# Patient Record
Sex: Female | Born: 1965 | Hispanic: Yes | Smoking: Never smoker
Health system: Southern US, Community
[De-identification: ages and names within clinical notes are randomized; demographics above are authoritative.]

## PROBLEM LIST (undated history)

## (undated) DIAGNOSIS — Z940 Kidney transplant status: Secondary | ICD-10-CM

## (undated) DIAGNOSIS — I10 Essential (primary) hypertension: Secondary | ICD-10-CM

## (undated) DIAGNOSIS — E119 Type 2 diabetes mellitus without complications: Secondary | ICD-10-CM

## (undated) HISTORY — PX: NEPHRECTOMY TRANSPLANTED ORGAN: SUR880

## (undated) HISTORY — PX: ABDOMINAL HYSTERECTOMY: SHX81

---

## 2019-03-18 ENCOUNTER — Emergency Department (HOSPITAL_COMMUNITY)
Admission: EM | Admit: 2019-03-18 | Discharge: 2019-03-18 | Disposition: A | Discharge status: Home / Self Care | Payer: BLUE CROSS/BLUE SHIELD | Attending: Emergency Medicine | Admitting: Emergency Medicine

## 2019-03-18 ENCOUNTER — Other Ambulatory Visit: Payer: Self-pay

## 2019-03-18 ENCOUNTER — Emergency Department (HOSPITAL_COMMUNITY): Payer: BLUE CROSS/BLUE SHIELD

## 2019-03-18 ENCOUNTER — Encounter (HOSPITAL_COMMUNITY): Payer: Self-pay

## 2019-03-18 DIAGNOSIS — R197 Diarrhea, unspecified: Secondary | ICD-10-CM

## 2019-03-18 DIAGNOSIS — U071 COVID-19: Secondary | ICD-10-CM | POA: Insufficient documentation

## 2019-03-18 DIAGNOSIS — Z940 Kidney transplant status: Secondary | ICD-10-CM | POA: Insufficient documentation

## 2019-03-18 DIAGNOSIS — R05 Cough: Secondary | ICD-10-CM | POA: Diagnosis not present

## 2019-03-18 DIAGNOSIS — R110 Nausea: Secondary | ICD-10-CM

## 2019-03-18 DIAGNOSIS — E119 Type 2 diabetes mellitus without complications: Secondary | ICD-10-CM | POA: Diagnosis not present

## 2019-03-18 DIAGNOSIS — I10 Essential (primary) hypertension: Secondary | ICD-10-CM | POA: Diagnosis not present

## 2019-03-18 DIAGNOSIS — R509 Fever, unspecified: Secondary | ICD-10-CM | POA: Diagnosis present

## 2019-03-18 HISTORY — DX: Essential (primary) hypertension: I10

## 2019-03-18 HISTORY — DX: Kidney transplant status: Z94.0

## 2019-03-18 HISTORY — DX: Type 2 diabetes mellitus without complications: E11.9

## 2019-03-18 LAB — CBC
HCT: 37.4 % (ref 36.0–46.0)
Hemoglobin: 12.2 g/dL (ref 12.0–15.0)
MCH: 28.7 pg (ref 26.0–34.0)
MCHC: 32.6 g/dL (ref 30.0–36.0)
MCV: 88 fL (ref 80.0–100.0)
Platelets: 206 10*3/uL (ref 150–400)
RBC: 4.25 MIL/uL (ref 3.87–5.11)
RDW: 12.7 % (ref 11.5–15.5)
WBC: 3.2 10*3/uL — ABNORMAL LOW (ref 4.0–10.5)
nRBC: 0 % (ref 0.0–0.2)

## 2019-03-18 LAB — URINALYSIS, ROUTINE W REFLEX MICROSCOPIC
Bilirubin Urine: NEGATIVE
Glucose, UA: NEGATIVE mg/dL
Hgb urine dipstick: NEGATIVE
Ketones, ur: NEGATIVE mg/dL
Leukocytes,Ua: NEGATIVE
Nitrite: NEGATIVE
Protein, ur: NEGATIVE mg/dL
Specific Gravity, Urine: 1.008 (ref 1.005–1.030)
pH: 6 (ref 5.0–8.0)

## 2019-03-18 LAB — COMPREHENSIVE METABOLIC PANEL
ALT: 14 U/L (ref 0–44)
AST: 18 U/L (ref 15–41)
Albumin: 3.7 g/dL (ref 3.5–5.0)
Alkaline Phosphatase: 55 U/L (ref 38–126)
Anion gap: 8 (ref 5–15)
BUN: 7 mg/dL (ref 6–20)
CO2: 27 mmol/L (ref 22–32)
Calcium: 8.4 mg/dL — ABNORMAL LOW (ref 8.9–10.3)
Chloride: 107 mmol/L (ref 98–111)
Creatinine, Ser: 0.73 mg/dL (ref 0.44–1.00)
GFR calc Af Amer: >60 mL/min (ref >60)
GFR calc non Af Amer: >60 mL/min (ref >60)
Glucose, Bld: 94 mg/dL (ref 70–99)
Potassium: 3.1 mmol/L — ABNORMAL LOW (ref 3.5–5.1)
Sodium: 142 mmol/L (ref 135–145)
Total Bilirubin: 0.6 mg/dL (ref 0.3–1.2)
Total Protein: 6.7 g/dL (ref 6.5–8.1)

## 2019-03-18 LAB — LIPASE, BLOOD: Lipase: 25 U/L (ref 11–51)

## 2019-03-18 LAB — LACTIC ACID, PLASMA
Lactic Acid, Venous: 0.8 mmol/L (ref 0.5–1.9)
Lactic Acid, Venous: 0.6 mmol/L (ref 0.5–1.9)

## 2019-03-18 LAB — RESPIRATORY PANEL BY RT PCR (FLU A&B, COVID)
Influenza A by PCR: NEGATIVE
Influenza B by PCR: NEGATIVE
SARS Coronavirus 2 by RT PCR: POSITIVE — AB

## 2019-03-18 MED ORDER — ACETAMINOPHEN 325 MG PO TABS
650.0000 mg | ORAL_TABLET | Freq: Once | ORAL | Status: AC
  Administered 2019-03-18: 650 mg via ORAL
  Filled 2019-03-18: qty 2

## 2019-03-18 MED ORDER — SODIUM CHLORIDE 0.9 % IV BOLUS
500.0000 mL | Freq: Once | INTRAVENOUS | Status: AC
  Administered 2019-03-18: 500 mL via INTRAVENOUS

## 2019-03-18 MED ORDER — SODIUM CHLORIDE 0.9% FLUSH: 3.0000 mL | Freq: Once | INTRAVENOUS | Status: DC

## 2019-03-18 MED ORDER — ONDANSETRON 4 MG PO TBDP
4.0000 mg | ORAL_TABLET | Freq: Once | ORAL | Status: AC
  Administered 2019-03-18: 4 mg via ORAL
  Filled 2019-03-18: qty 1

## 2019-03-18 MED ORDER — LOPERAMIDE HCL 2 MG PO CAPS: 2.0000 mg | ORAL_CAPSULE | Freq: Four times a day (QID) | ORAL | 0 refills | Status: AC | PRN

## 2019-03-18 MED ORDER — ONDANSETRON 4 MG PO TBDP: 4.0000 mg | ORAL_TABLET | Freq: Three times a day (TID) | ORAL | 0 refills | Status: AC | PRN

## 2019-03-18 NOTE — Discharge Instructions (Signed)
Hoy te atendieron en el departamento de emergencias y te diagnosticaron COVID-19. Puede tomar Tylenol segn sea necesario para el dolor de cabeza junto con Zofran e Imodium segn sea necesario para las nuseas y la diarrea. Debe regresar al departamento de emergencias de inmediato si sus sntomas empeoran. Llame a su mdico de atencin primaria maana para alertarlo sobre su infeccin por COVID-19.

## 2019-03-18 NOTE — ED Notes (Signed)
Provided pt labeled urine specimen cup for collection per MD order. [employer]

## 2019-03-18 NOTE — ED Notes (Signed)
Date and time results received: 03/18/19 2228 (use smartphrase ".now" to insert current time)  Test: COVID Critical Value: Positive  Name of Provider Notified: Dr.Long  Orders Received? Or Actions Taken?:

## 2019-03-18 NOTE — ED Notes (Signed)
Labeled culture sent w/urine specimen to lab. [employer]

## 2019-03-18 NOTE — ED Provider Notes (Signed)
Emergency Department Provider Note   I have reviewed the triage vital signs and the nursing notes.   HISTORY  Chief Complaint No chief complaint on file.  Spanish interpreter (video) used for HPI, ROS, and Exam.  HPI Jane Hicks is a 54 y.o. female with past medical history of hypertension, diabetes, kidney transplant in 2013 in [place], [place] presents to the emergency department for evaluation of subjective fever, body aches, cough, diarrhea, and nausea.  Patient has been feeling poorly for the last 1 to 2 weeks with symptoms seeming to worsen.  Her last episode of diarrhea was yesterday.  She denies blood in the diarrhea.  No recent antibiotics.  She has had nausea but no vomiting.  She is not experiencing abdominal pain or chest pain.  She denies shortness of breath.  No known COVID-19 contact.  She remains compliant with her home medications including tacrolimus.  She does not follow with a primary care doctor locally.  She continues to make urine without difficulty. Tmax 101 F was recorded at home yesterday.    Past Medical History:  Diagnosis Date  . Diabetes mellitus without complication (HCC)   . Hypertension   . Kidney transplant recipient     There are no problems to display for this patient.   Past Surgical History:  Procedure Laterality Date  . ABDOMINAL HYSTERECTOMY    . NEPHRECTOMY TRANSPLANTED ORGAN      Allergies Patient has no known allergies.  Family History  Problem Relation Age of Onset  . Chronic Renal Failure Mother     Social History Social History   Tobacco Use  . Smoking status: Never Smoker  . Smokeless tobacco: Never Used  Substance Use Topics  . Alcohol use: Never  . Drug use: Never    Review of Systems  Constitutional: Positive fever and body aches.  Eyes: No visual changes. ENT: No sore throat. Cardiovascular: Denies chest pain. Respiratory: Denies shortness of breath. Positive cough.  Gastrointestinal: No abdominal pain.  Positive nausea, no vomiting. Positive diarrhea.  No constipation. Genitourinary: Negative for dysuria. Musculoskeletal: Negative for back pain. Skin: Negative for rash. Neurological: Negative for focal weakness or numbness. Positive HA.   10-point ROS otherwise negative.  ____________________________________________   PHYSICAL EXAM:  VITAL SIGNS: ED Triage Vitals  Enc Vitals Group     BP 03/18/19 1701 108/73     Pulse Rate 03/18/19 1701 78     Resp 03/18/19 1701 18     Temp 03/18/19 1701 100.1 F (37.8 C)     Temp Source 03/18/19 1701 Oral     SpO2 03/18/19 1701 98 %     Weight 03/18/19 1722 138 lb (62.6 kg)     Height 03/18/19 1722 5\' 6"  (1.676 m)   Constitutional: Alert and oriented. Well appearing and in no acute distress. Eyes: Conjunctivae are normal.  Head: Atraumatic. Nose: No congestion/rhinnorhea. Mouth/Throat: Mucous membranes are moist.  Neck: No stridor.  No meningeal signs.   Cardiovascular: Normal rate, regular rhythm. Good peripheral circulation. Grossly normal heart sounds.   Respiratory: Normal respiratory effort.  No retractions. Lungs CTAB. Gastrointestinal: Soft with focal RLQ tenderness. No rebound or guarding. No distention.  Musculoskeletal: No lower extremity tenderness nor edema. No gross deformities of extremities. Neurologic:  Normal speech and language. No gross focal neurologic deficits are appreciated.  Skin:  Skin is warm, dry and intact. No rash noted. ____________________________________________   LABS (all labs ordered are listed, but only abnormal results are displayed)  Labs  Reviewed  RESPIRATORY PANEL BY RT PCR (FLU A&B, COVID) - Abnormal; Notable for the following components:      Result Value   SARS Coronavirus 2 by RT PCR POSITIVE (*)    All other components within normal limits  COMPREHENSIVE METABOLIC PANEL - Abnormal; Notable for the following components:   Potassium 3.1 (*)    Calcium 8.4 (*)    All other components  within normal limits  CBC - Abnormal; Notable for the following components:   WBC 3.2 (*)    All other components within normal limits  CULTURE, BLOOD (ROUTINE X 2)  CULTURE, BLOOD (ROUTINE X 2)  LIPASE, BLOOD  URINALYSIS, ROUTINE W REFLEX MICROSCOPIC  LACTIC ACID, PLASMA  LACTIC ACID, PLASMA   ____________________________________________  RADIOLOGY  DG Chest Portable 1 View  Result Date: 03/18/2019 CLINICAL DATA:  Fever, cough EXAM: PORTABLE CHEST 1 VIEW COMPARISON:  None. FINDINGS: Heart is normal size. Bilateral lower lobe airspace opacities concerning for pneumonia. No effusions. No acute bony abnormality. IMPRESSION: Bilateral lower lobe airspace opacities concerning for pneumonia. Electronically Signed   By: Rolm Baptise M.D.   On: 03/18/2019 20:53    ____________________________________________   PROCEDURES  Procedure(s) performed:   Procedures  None ____________________________________________   INITIAL IMPRESSION / ASSESSMENT AND PLAN / ED COURSE  Pertinent labs & imaging results that were available during my care of the patient were reviewed by me and considered in my medical decision making (see chart for details).   Patient with history of kidney transplant presents with fever at home with body aches and other constitutional type symptoms.  Denies abdominal pain but does appear to have some more focal tenderness in the right lower quadrant.  COVID-19 is a consideration.  Lower suspicion clinically for acute appendicitis but given her focal tenderness I will follow on additional lab work including cultures and lactate and consider CT imaging as needed. IVF given in the ED for now.  Concern clinically for serious bacterial infection, bacteremia, meningitis is very low.  CXR with evidence of PNA dn COVID PCR test is positive. Labs unremarkable. No significant respiratory symptoms. Specifically no CP or SOB. No hypoxemia. Discussed COVID diagnosis with patient using  Spanish interpreter. Plan for supportive care at home and close PCP follow up by phone. Patient plans to call tomorrow. Discussed strict ED return precautions with worsening symptoms and discussed that she is at elevated risk for worsening given her immunocompromised status s/p kidney transplant but that she does not require hospitalization at this time.   Jane Hicks was evaluated in Emergency Department on 03/19/2019 for the symptoms described in the history of present illness. She was evaluated in the context of the global COVID-19 pandemic, which necessitated consideration that the patient might be at risk for infection with the SARS-CoV-2 virus that causes COVID-19. Institutional protocols and algorithms that pertain to the evaluation of patients at risk for COVID-19 are in a state of rapid change based on information released by regulatory bodies including the CDC and federal and state organizations. These policies and algorithms were followed during the patient's care in the ED.  ____________________________________________  FINAL CLINICAL IMPRESSION(S) / ED DIAGNOSES  Final diagnoses:  COVID-19  Diarrhea of presumed infectious origin  Nausea     MEDICATIONS GIVEN DURING THIS VISIT:  Medications  sodium chloride 0.9 % bolus 500 mL (0 mLs Intravenous Stopped 03/18/19 2102)  acetaminophen (TYLENOL) tablet 650 mg (650 mg Oral Given 03/18/19 2309)  ondansetron (ZOFRAN-ODT) disintegrating tablet 4 mg (  4 mg Oral Given 03/18/19 2309)     NEW OUTPATIENT MEDICATIONS STARTED DURING THIS VISIT:  Discharge Medication List as of 03/18/2019 10:54 PM    START taking these medications   Details  loperamide (IMODIUM) 2 MG capsule Take 1 capsule (2 mg total) by mouth 4 (four) times daily as needed for diarrhea or loose stools., Starting Mon 03/18/2019, Print    ondansetron (ZOFRAN ODT) 4 MG disintegrating tablet Take 1 tablet (4 mg total) by mouth every 8 (eight) hours as needed for nausea or vomiting.,  Starting Mon 03/18/2019, Print        Note:  This document was prepared using Dragon voice recognition software and may include unintentional dictation errors.  Alona Bene, MD, [facility]    Joe Tanney, Arlyss Repress, MD 03/19/19 1009

## 2019-03-18 NOTE — ED Triage Notes (Signed)
Patient c/o headache, nausea, back pain, and nausea x 2 weeks.  Patient has a history of kidney transplant.

## 2019-03-23 LAB — CULTURE, BLOOD (ROUTINE X 2)
Culture: NO GROWTH
Culture: NO GROWTH
Special Requests: ADEQUATE

## 2019-04-04 ENCOUNTER — Emergency Department (HOSPITAL_COMMUNITY)
Admission: EM | Admit: 2019-04-04 | Discharge: 2019-04-04 | Disposition: A | Discharge status: Home / Self Care | Payer: BLUE CROSS/BLUE SHIELD | Attending: Emergency Medicine | Admitting: Emergency Medicine

## 2019-04-04 ENCOUNTER — Emergency Department (HOSPITAL_COMMUNITY): Payer: BLUE CROSS/BLUE SHIELD

## 2019-04-04 ENCOUNTER — Other Ambulatory Visit: Payer: Self-pay

## 2019-04-04 ENCOUNTER — Ambulatory Visit (HOSPITAL_BASED_OUTPATIENT_CLINIC_OR_DEPARTMENT_OTHER): Payer: BLUE CROSS/BLUE SHIELD

## 2019-04-04 DIAGNOSIS — Z79899 Other long term (current) drug therapy: Secondary | ICD-10-CM | POA: Insufficient documentation

## 2019-04-04 DIAGNOSIS — E119 Type 2 diabetes mellitus without complications: Secondary | ICD-10-CM | POA: Diagnosis not present

## 2019-04-04 DIAGNOSIS — Z940 Kidney transplant status: Secondary | ICD-10-CM | POA: Diagnosis not present

## 2019-04-04 DIAGNOSIS — I10 Essential (primary) hypertension: Secondary | ICD-10-CM | POA: Insufficient documentation

## 2019-04-04 DIAGNOSIS — R52 Pain, unspecified: Secondary | ICD-10-CM | POA: Diagnosis not present

## 2019-04-04 DIAGNOSIS — R2243 Localized swelling, mass and lump, lower limb, bilateral: Secondary | ICD-10-CM | POA: Diagnosis not present

## 2019-04-04 DIAGNOSIS — R1030 Lower abdominal pain, unspecified: Secondary | ICD-10-CM | POA: Diagnosis present

## 2019-04-04 DIAGNOSIS — U071 COVID-19: Secondary | ICD-10-CM | POA: Insufficient documentation

## 2019-04-04 DIAGNOSIS — Z7984 Long term (current) use of oral hypoglycemic drugs: Secondary | ICD-10-CM | POA: Insufficient documentation

## 2019-04-04 LAB — CBC WITH DIFFERENTIAL/PLATELET
Abs Immature Granulocytes: 0.03 10*3/uL (ref 0.00–0.07)
Basophils Absolute: 0.1 10*3/uL (ref 0.0–0.1)
Basophils Relative: 2 %
Eosinophils Absolute: 0.2 10*3/uL (ref 0.0–0.5)
Eosinophils Relative: 4 %
HCT: 35.4 % — ABNORMAL LOW (ref 36.0–46.0)
Hemoglobin: 11.5 g/dL — ABNORMAL LOW (ref 12.0–15.0)
Immature Granulocytes: 1 %
Lymphocytes Relative: 27 %
Lymphs Abs: 1.4 10*3/uL (ref 0.7–4.0)
MCH: 29 pg (ref 26.0–34.0)
MCHC: 32.5 g/dL (ref 30.0–36.0)
MCV: 89.2 fL (ref 80.0–100.0)
Monocytes Absolute: 0.7 10*3/uL (ref 0.1–1.0)
Monocytes Relative: 12 %
Neutro Abs: 3 10*3/uL (ref 1.7–7.7)
Neutrophils Relative %: 54 %
Platelets: 358 10*3/uL (ref 150–400)
RBC: 3.97 MIL/uL (ref 3.87–5.11)
RDW: 13.4 % (ref 11.5–15.5)
WBC: 5.4 10*3/uL (ref 4.0–10.5)
nRBC: 0 % (ref 0.0–0.2)

## 2019-04-04 LAB — COMPREHENSIVE METABOLIC PANEL
ALT: 17 U/L (ref 0–44)
AST: 16 U/L (ref 15–41)
Albumin: 3.8 g/dL (ref 3.5–5.0)
Alkaline Phosphatase: 55 U/L (ref 38–126)
Anion gap: 9 (ref 5–15)
BUN: 14 mg/dL (ref 6–20)
CO2: 26 mmol/L (ref 22–32)
Calcium: 8.5 mg/dL — ABNORMAL LOW (ref 8.9–10.3)
Chloride: 109 mmol/L (ref 98–111)
Creatinine, Ser: 0.82 mg/dL (ref 0.44–1.00)
GFR calc Af Amer: >60 mL/min (ref >60)
GFR calc non Af Amer: >60 mL/min (ref >60)
Glucose, Bld: 98 mg/dL (ref 70–99)
Potassium: 3.5 mmol/L (ref 3.5–5.1)
Sodium: 144 mmol/L (ref 135–145)
Total Bilirubin: 0.3 mg/dL (ref 0.3–1.2)
Total Protein: 6.8 g/dL (ref 6.5–8.1)

## 2019-04-04 LAB — URINALYSIS, ROUTINE W REFLEX MICROSCOPIC
Bilirubin Urine: NEGATIVE
Glucose, UA: NEGATIVE mg/dL
Hgb urine dipstick: NEGATIVE
Ketones, ur: NEGATIVE mg/dL
Leukocytes,Ua: NEGATIVE
Nitrite: NEGATIVE
Protein, ur: NEGATIVE mg/dL
Specific Gravity, Urine: 1.012 (ref 1.005–1.030)
pH: 8 (ref 5.0–8.0)

## 2019-04-04 LAB — BRAIN NATRIURETIC PEPTIDE: B Natriuretic Peptide: 90.1 pg/mL (ref 0.0–100.0)

## 2019-04-04 NOTE — Discharge Instructions (Addendum)
Todos sus resultados fueron normales hoy.  Por favor haga cita con su doctor primario para seguir con sus chequeos.  Su prueba de Covid estara lista en 24-48 horas.

## 2019-04-04 NOTE — ED Triage Notes (Signed)
Per patient, she is a kidney transplant recipient. She has developed pain in back, abdomen and lower legs. Also states lower legs are swelling. Patient went to UC to get her "kidney function" checked and UC referred to ED.

## 2019-04-04 NOTE — Progress Notes (Signed)
VASCULAR LAB PRELIMINARY  PRELIMINARY  PRELIMINARY  PRELIMINARY  Bilateral lower extremity venous duplex completed.    Preliminary report:  See CV proc for preliminary results.  Gave Dr. Particia Nearing results.   Hadlie Gipson, RVT 04/04/2019, 3:41 PM

## 2019-04-04 NOTE — ED Provider Notes (Signed)
[facility]-EMERGENCY DEPT Provider Note   CSN: [id number] Arrival date & time: 04/04/19  1142     History Chief Complaint  Patient presents with  . Back Pain  . Foot Swelling    Jane Hicks is a 54 y.o. female.  54 y.o female with a PMH of DM, HTN, Kidney transplant on 01/01/2012 presents to the ED with a chief complaint of bilateral leg swelling along with abdominal pain x 4 days. Patient reports pain along the lower aspect of her abdomen this is somewhat cramping and feels similar to her prior episode of kidney stones. She also endorses leg swelling and itching, reports this is worse at night and alleviated with propping her legs up.  She was evaluated in urgent care this morning however sent to the ED for further care.  She does also endorse some dysuria, reports she is noted her urine to be of darker color.  Denies any prior history of diabetes, this is induced by the medication she has been taking for her kidney transplant.  Also endorses back pain, this seems to be more on the upper side worse with movement along with sitting.  Patient endorses chills.  No fever, nausea, vomiting,chest pain. Of note, patient did covid 19 infection on 03/18/2019 but reports her symptoms have now resolved and she is asymptomatic.   The history is provided by the patient and medical records.  Back Pain Associated symptoms: abdominal pain   Associated symptoms: no chest pain, no fever and no headaches        Past Medical History:  Diagnosis Date  . Diabetes mellitus without complication (HCC)   . Hypertension   . Kidney transplant recipient     There are no problems to display for this patient.   Past Surgical History:  Procedure Laterality Date  . ABDOMINAL HYSTERECTOMY    . NEPHRECTOMY TRANSPLANTED ORGAN       OB History   No obstetric history on file.     Family History  Problem Relation Age of Onset  . Chronic Renal Failure Mother     Social History    Tobacco Use  . Smoking status: Never Smoker  . Smokeless tobacco: Never Used  Substance Use Topics  . Alcohol use: Never  . Drug use: Never    Home Medications Prior to Admission medications   Medication Sig Start Date End Date Taking? Authorizing Provider  cloNIDine (CATAPRES) 0.1 MG tablet Take 0.1 mg by mouth daily as needed (anxiety).   Yes [provider]  glipiZIDE (GLUCOTROL) 5 MG tablet Take 5 mg by mouth daily before breakfast.   Yes [provider]  loperamide (IMODIUM) 2 MG capsule Take 1 capsule (2 mg total) by mouth 4 (four) times daily as needed for diarrhea or loose stools. 03/18/19  Yes Long, Arlyss Repress, MD  mycophenolate (MYFORTIC) 360 MG TBEC EC tablet Take 360 mg by mouth 3 (three) times daily.   Yes [provider]  NIFEdipine (PROCARDIA) 10 MG capsule Take 10 mg by mouth 3 (three) times daily.   Yes [provider]  ondansetron (ZOFRAN ODT) 4 MG disintegrating tablet Take 1 tablet (4 mg total) by mouth every 8 (eight) hours as needed for nausea or vomiting. 03/18/19  Yes Long, Arlyss Repress, MD  pantoprazole (PROTONIX) 40 MG tablet Take 40 mg by mouth daily.   Yes [provider]  potassium chloride (KLOR-CON) 20 MEQ packet Take 20 mEq by mouth daily.   Yes [provider]  saxagliptin HCl (ONGLYZA) 5 MG TABS tablet Take 5 mg by mouth daily.   Yes [provider]  simvastatin (ZOCOR) 10 MG tablet Take 10 mg by mouth daily.   Yes [provider]  spironolactone (ALDACTONE) 100 MG tablet Take 100 mg by mouth daily.   Yes [provider]  tacrolimus (PROGRAF) 1 MG capsule Take 3 mg by mouth 2 (two) times daily.   Yes [provider]    Allergies    Patient has no known allergies.  Review of Systems   Review of Systems  Constitutional: Positive for chills. Negative for fever.  HENT: Negative for sinus pressure and sore throat.   Respiratory: Negative for shortness of breath.    Cardiovascular: Negative for chest pain.  Gastrointestinal: Positive for abdominal pain. Negative for nausea and vomiting.  Genitourinary: Negative for flank pain.  Musculoskeletal: Positive for back pain.  Skin: Negative for pallor and wound.  Neurological: Negative for light-headedness and headaches.  All other systems reviewed and are negative.   Physical Exam Updated Vital Signs BP 116/80   Pulse 69   Temp 98.8 F (37.1 C) (Oral)   Resp 15   Ht 5\' 6"  (1.676 m)   Wt 63.5 kg   LMP 03/18/2019   SpO2 98%   BMI 22.60 kg/m   Physical Exam Vitals and nursing note reviewed.  Constitutional:      Appearance: Normal appearance.  HENT:     Head: Normocephalic and atraumatic.     Mouth/Throat:     Mouth: Mucous membranes are moist.  Eyes:     Pupils: Pupils are equal, round, and reactive to light.  Cardiovascular:     Rate and Rhythm: Normal rate.  Pulmonary:     Effort: Pulmonary effort is normal.     Breath sounds: No wheezing or rales.     Comments: Lungs are cleared to ascultation without any wheezing, rhonchi, rales.  Abdominal:     General: Abdomen is flat. Bowel sounds are normal.     Tenderness: There is abdominal tenderness in the right lower quadrant and suprapubic area. There is no right CVA tenderness, left CVA tenderness, guarding or rebound.     Comments: Mild TTP along the RLQ in the position of right kidney transplant.   Musculoskeletal:     Cervical back: Normal range of motion and neck supple.  Skin:    General: Skin is warm and dry.  Neurological:     Mental Status: She is alert and oriented to person, place, and time.     ED Results / Procedures / Treatments   Labs (all labs ordered are listed, but only abnormal results are displayed) Labs Reviewed  CBC WITH DIFFERENTIAL/PLATELET - Abnormal; Notable for the following components:      Result Value   Hemoglobin 11.5 (*)    HCT 35.4 (*)    All other components within normal limits  COMPREHENSIVE  METABOLIC PANEL - Abnormal; Notable for the following components:   Calcium 8.5 (*)    All other components within normal limits  URINE CULTURE  NOVEL CORONAVIRUS, NAA (HOSP ORDER, SEND-OUT TO REF LAB; TAT 18-24 HRS)  URINALYSIS, ROUTINE W REFLEX MICROSCOPIC  BRAIN NATRIURETIC PEPTIDE    EKG None  Radiology 05/16/2019 Renal  Result Date: 04/04/2019 CLINICAL DATA:  Transplant recipient, aching legs EXAM: RENAL / URINARY TRACT ULTRASOUND COMPLETE COMPARISON:  None. FINDINGS: Right Kidney: Poorly visualized and likely atrophic with multiple cysts. Left Kidney: Poorly visualized and likely  atrophic with multiple cysts. RLQ Transplant: Measures 9.4 x 5.2 x 6.2 cm = volume of 158 mL. Normal echogenicity. No mass. Mild hydroureter and hydronephrosis. Bladder: Appears normal for degree of bladder distention. Other: None. IMPRESSION: Right lower quadrant transplant kidney with mild hydroureter and hydronephrosis. Electronically Signed   By: Macy Mis M.D.   On: 04/04/2019 15:21   CT Renal Stone Study  Result Date: 04/04/2019 CLINICAL DATA:  Urinary tract stone. Prior renal transplant. Back pain and abdominal pain. EXAM: CT ABDOMEN AND PELVIS WITHOUT CONTRAST TECHNIQUE: Multidetector CT imaging of the abdomen and pelvis was performed following the standard protocol without IV contrast. COMPARISON:  None. FINDINGS: Lower chest: There are bibasilar ground-glass airspace opacities. A few subpleural reticular lung markings are noted.The heart size is normal. Hepatobiliary: Multiple simple appearing hepatic cysts are noted. Normal gallbladder.There is no biliary ductal dilation. Pancreas: The pancreas is unremarkable. Spleen: There is a peripherally calcified area in the upper pole of the spleen likely related to an old traumatic or infectious insult. Adrenals/Urinary Tract: --Adrenal glands: No adrenal hemorrhage. --Right kidney/ureter: There are innumerable cysts involving the right kidney. There is no evidence  for right-sided hydronephrosis. --Left kidney/ureter: There are innumerable cysts involving the left kidney. There is a hyperdense 2.5 cm nodule arising from the upper pole measuring approximately 25 Hounsfield units. --Urinary bladder: Unremarkable. There is a right lower quadrant transplant kidney with mild pelviectasis. There is no radiopaque obstructing stone. Stomach/Bowel: --Stomach/Duodenum: No hiatal hernia or other gastric abnormality. Normal duodenal course and caliber. --Small bowel: No dilatation or inflammation. --Colon: No focal abnormality. --Appendix: Normal. Vascular/Lymphatic: Atherosclerotic calcification is present within the non-aneurysmal abdominal aorta, without hemodynamically significant stenosis. --No retroperitoneal lymphadenopathy. --No mesenteric lymphadenopathy. --No pelvic or inguinal lymphadenopathy. Reproductive: Status post hysterectomy. No adnexal mass. Other: No ascites or free air. There is a fat containing umbilical hernia. Musculoskeletal. No acute displaced fractures. IMPRESSION: 1. Right pelvic transplant kidney with mild pelviectasis. There is no evidence for an obstructing lesion or stone. 2. 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 There are few hyperdense nodules involving the left kidney favored to represent a proteinaceous or hemorrhagic cyst. Outpatient renal ultrasound follow-up is recommended. 3. Scattered airspace opacities at the lung bases bilaterally may represent an atypical or opportunistic infection in the appropriate clinical setting. Electronically Signed   By: Constance Holster M.D.   On: 04/04/2019 17:34   VAS [place] LOWER EXTREMITY VENOUS (DVT) (ONLY [facility] & [facility] 7a-7p)  Result Date: 04/04/2019  Lower Venous DVTStudy Indications: Pain.  Comparison Study: No prior study on file Performing Technologist: Sharion Dove RVS  Examination Guidelines: A complete evaluation includes B-mode imaging, spectral Doppler, color Doppler, and power Doppler as needed  of all accessible portions of each vessel. Bilateral testing is considered an integral part of a complete examination. Limited examinations for reoccurring indications may be performed as noted. The reflux portion of the exam is performed with the patient in reverse Trendelenburg.  +---------+---------------+---------+-----------+----------+--------------+ RIGHT    CompressibilityPhasicitySpontaneityPropertiesThrombus Aging +---------+---------------+---------+-----------+----------+--------------+ CFV      Full           Yes      Yes                                 +---------+---------------+---------+-----------+----------+--------------+ SFJ      Full                                                        +---------+---------------+---------+-----------+----------+--------------+  FV Prox  Full                                                        +---------+---------------+---------+-----------+----------+--------------+ FV Mid   Full                                                        +---------+---------------+---------+-----------+----------+--------------+ FV DistalFull                                                        +---------+---------------+---------+-----------+----------+--------------+ PFV      Full                                                        +---------+---------------+---------+-----------+----------+--------------+ POP      Full           Yes      Yes                                 +---------+---------------+---------+-----------+----------+--------------+ PTV      Full                                                        +---------+---------------+---------+-----------+----------+--------------+ PERO     Full                                                        +---------+---------------+---------+-----------+----------+--------------+    +---------+---------------+---------+-----------+----------+--------------+ LEFT     CompressibilityPhasicitySpontaneityPropertiesThrombus Aging +---------+---------------+---------+-----------+----------+--------------+ CFV      Full           Yes      Yes                                 +---------+---------------+---------+-----------+----------+--------------+ SFJ      Full                                                        +---------+---------------+---------+-----------+----------+--------------+ FV Prox  Full                                                        +---------+---------------+---------+-----------+----------+--------------+  FV Mid   Full                                                        +---------+---------------+---------+-----------+----------+--------------+ FV DistalFull                                                        +---------+---------------+---------+-----------+----------+--------------+ PFV      Full                                                        +---------+---------------+---------+-----------+----------+--------------+ POP      Full           Yes      Yes                                 +---------+---------------+---------+-----------+----------+--------------+ PTV      Full                                                        +---------+---------------+---------+-----------+----------+--------------+ PERO     Full                                                        +---------+---------------+---------+-----------+----------+--------------+     Summary: BILATERAL: - No evidence of deep vein thrombosis seen in the lower extremities, bilaterally.   *See table(s) above for measurements and observations.    Preliminary     Procedures Procedures (including critical care time)  Medications Ordered in ED Medications - No data to display  ED Course  I have reviewed the triage vital  signs and the nursing notes.  Pertinent labs & imaging results that were available during my care of the patient were reviewed by me and considered in my medical decision making (see chart for details).    MDM Rules/Calculators/A&P   Patient with a past medical history of hypertension, diabetes, kidney transplant 2013 presents to the ED with complaints of lower abdominal pain along with bilateral leg swelling.  Symptoms began 4 days ago, describes the legs as worsening throughout the day but alleviated with elevation of her legs.  She also reports some changes in her urine, some dysuria along with some changes in the color of her urine such as this being darker.  She reports this episode feels similar to her prior history of kidney stones although unsure whether this is causing her symptoms.  She also endorses some chills but no fevers at home.  She was diagnosed with COVID-19 exactly a month ago but reports the symptoms have now resolved.   During evaluation of her  abdomen there is mild tenderness to palpation along the right lower quadrant and the positioning of the right kidney transplant.  Bowel sounds are present.  The rest of her abdomen is soft without distention.  Lungs are clear to auscultation.  Bilateral legs slightly remarkable for 1+ pitting edema, pulses are present and symmetric.  No rashes, lacerations, fissures noted to her feet.  Did rotation of her labs by me, reveal a CMP without any electrolyte normality, creatinine level is within normal limits.  LFTs are unremarkable.  CBC without any leukocytosis, hemoglobin is within normal limits.  BNP is negative.  No signs of fluid overload on my physical exam.  UA without any nitrates, leukocytes, white blood cell count.  An ultrasound was obtained which showed: Right lower quadrant transplant kidney with mild hydroureter and  hydronephrosis.   I have also obtain an ultrasound of her bilateral lower extremities as patient reported pain  to the area, no DVT present.  We discussed the results of her ultrasound and I feel that seeing as patient is a transplant patient will further evaluate this with CT renal. CT renal showed: 1. Right pelvic transplant kidney with mild pelviectasis. There is  no evidence for an obstructing lesion or stone.  2. 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 There are few  hyperdense nodules involving the left kidney favored to represent a  proteinaceous or hemorrhagic cyst. Outpatient renal ultrasound  follow-up is recommended.  3. Scattered airspace opacities at the lung bases bilaterally may  represent an atypical or opportunistic infection in the appropriate  clinical setting.      These results were explained at length to patient.  She is requesting a COVID-19 swab but she had Covid back at the beginning of the month.  Will obtain this for patient with a 48-hour turnaround.  She is advised to follow-up with her PCP as needed.  Patient understands agrees with management.  Return precautions discussed at length.   Portions of this note were generated with Scientist, clinical (histocompatibility and immunogenetics). Dictation errors may occur despite best attempts at proofreading.  Final Clinical Impression(s) / ED Diagnoses Final diagnoses:  Lower abdominal pain    Rx / DC Orders ED Discharge Orders    None       [place], [place], PA-C 04/04/19 1859    Jacalyn Lefevre, MD 04/05/19 [phone]

## 2019-04-05 ENCOUNTER — Telehealth (HOSPITAL_COMMUNITY): Payer: Self-pay

## 2019-04-05 LAB — NOVEL CORONAVIRUS, NAA (HOSP ORDER, SEND-OUT TO REF LAB; TAT 18-24 HRS): SARS-CoV-2, NAA: DETECTED — AB

## 2019-04-05 LAB — URINE CULTURE

## 2019-04-05 NOTE — Progress Notes (Signed)
Called patient to let them know of a critical positive lab result of positive covid.  Left a message with patient to call back to get recent lab results from admission.

## 2020-03-17 IMAGING — CT CT RENAL STONE PROTOCOL
2 series · 15 of 42 positions shown, 18 images · non-contrast
Comparison: None.

CLINICAL DATA: Urinary tract stone. Prior renal transplant. Back
pain and abdominal pain.

EXAM:
CT ABDOMEN AND PELVIS WITHOUT CONTRAST
TECHNIQUE: Multidetector CT imaging of the abdomen and pelvis was performed
following the standard protocol without IV contrast.

[Series 2: axial st · axial · 0.70mm/px · z∈[-330,+64]mm · 12 of 88 slices shown, 15 images]
[im 6/88  soft-tissue]
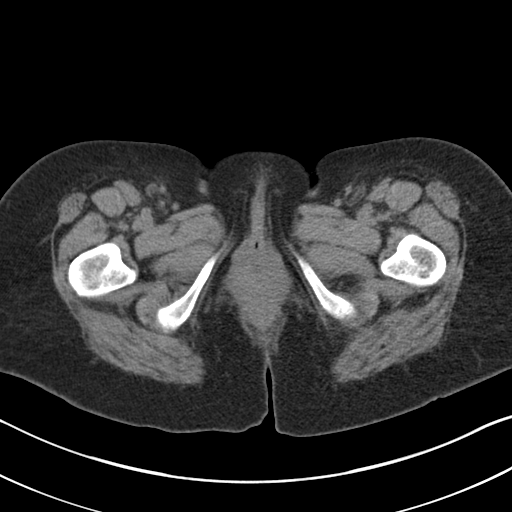
[im 6/88  bone]
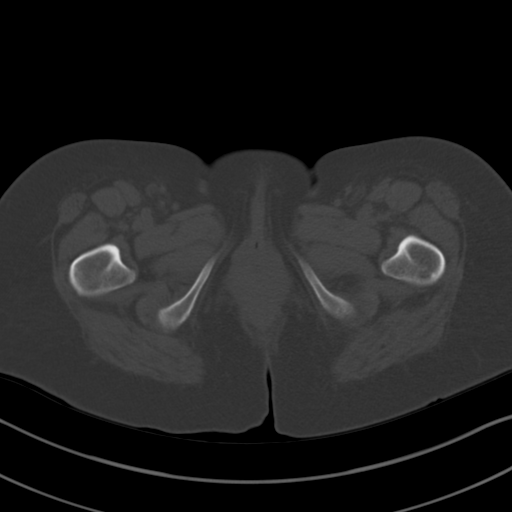
[im 17/88  soft-tissue]
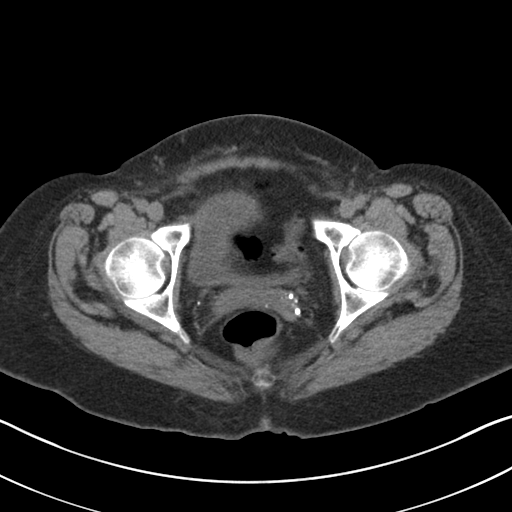
[im 26/88  soft-tissue]
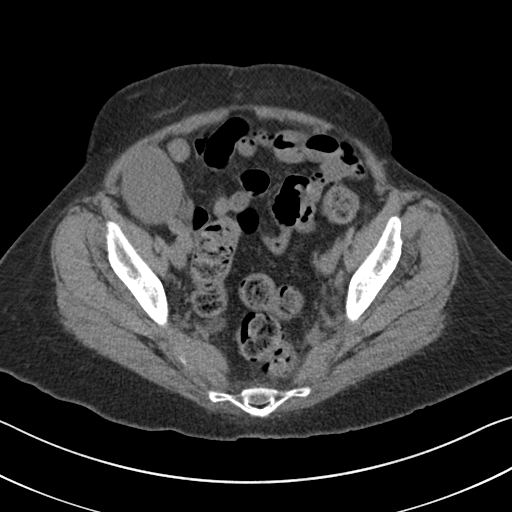
[im 34/88  soft-tissue]
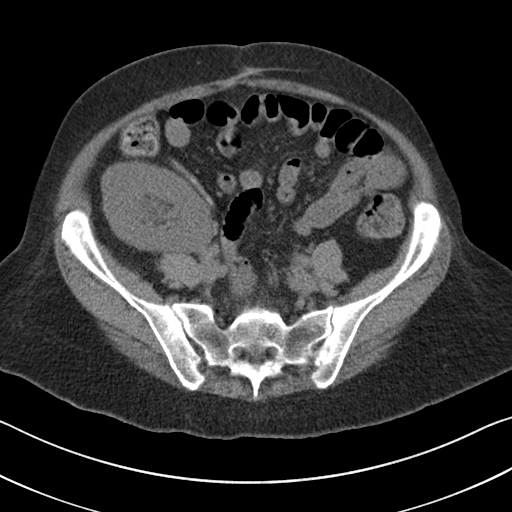
[im 45/88  soft-tissue]
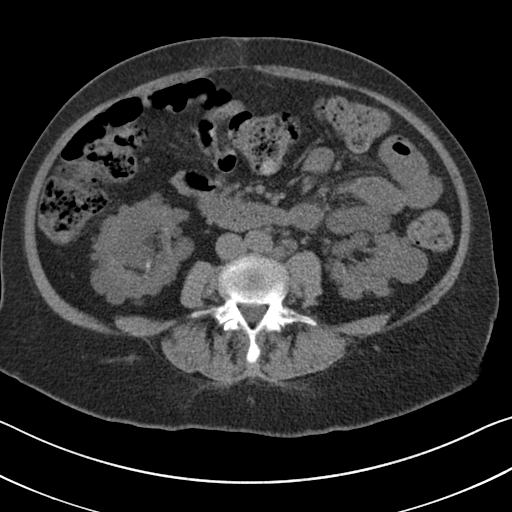
[im 54/88  soft-tissue]
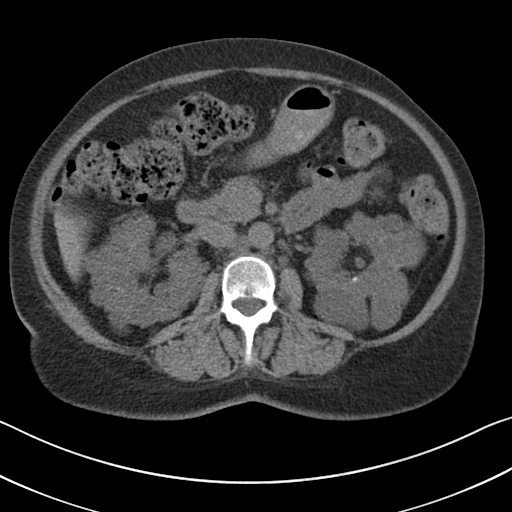
[im 62/88  soft-tissue]
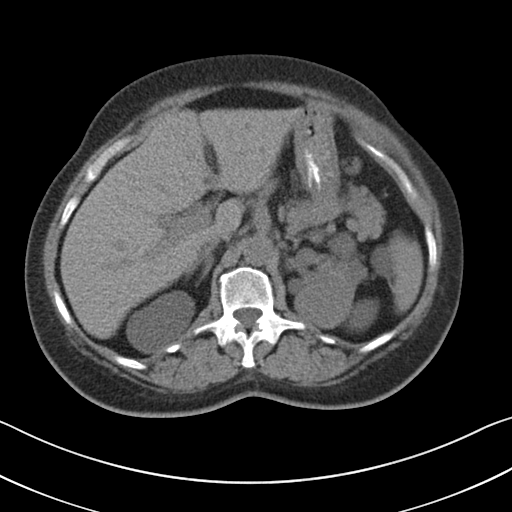
[im 73/88  soft-tissue]
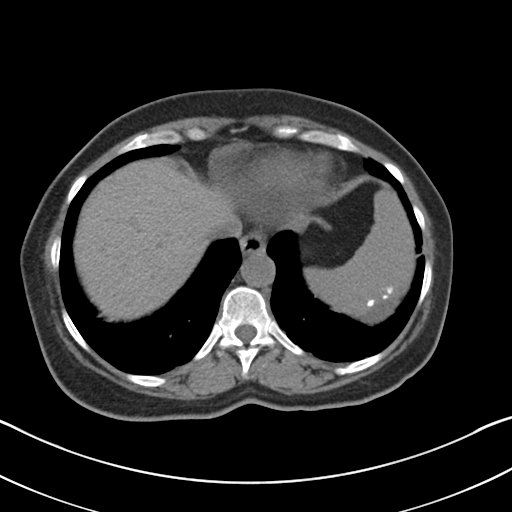
[im 76/88  lung]
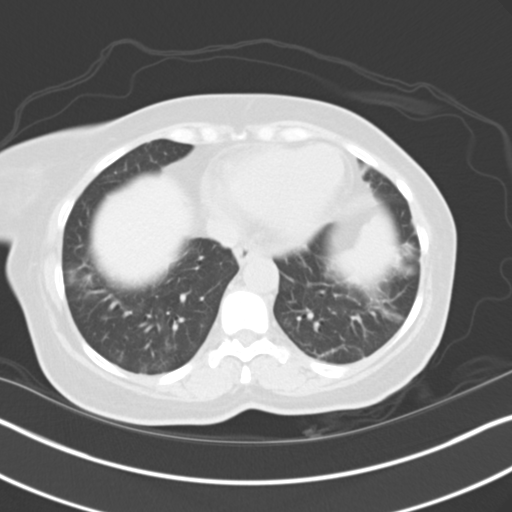
[im 79/88  lung]
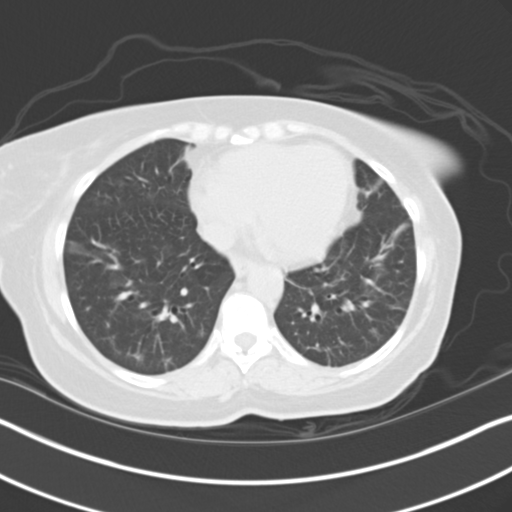
[im 82/88  soft-tissue]
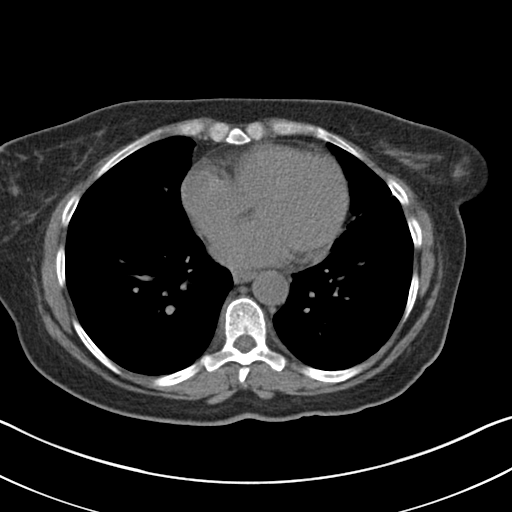
[im 82/88  lung]
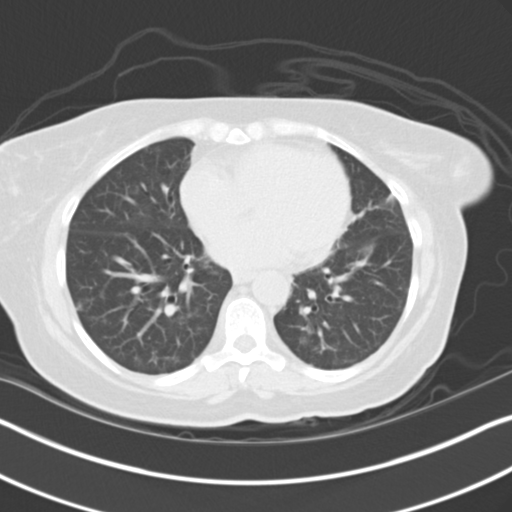
[im 82/88  bone]
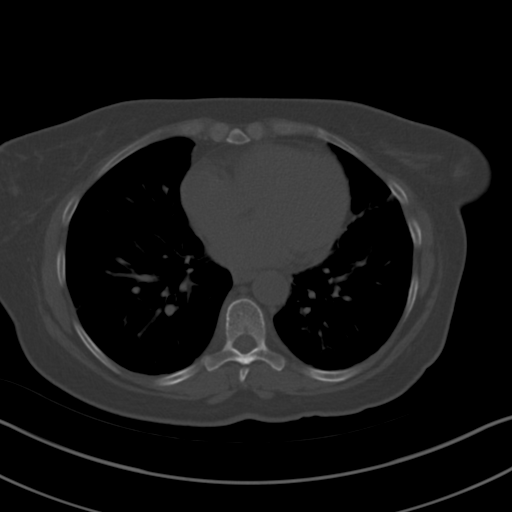
[im 85/88  lung]
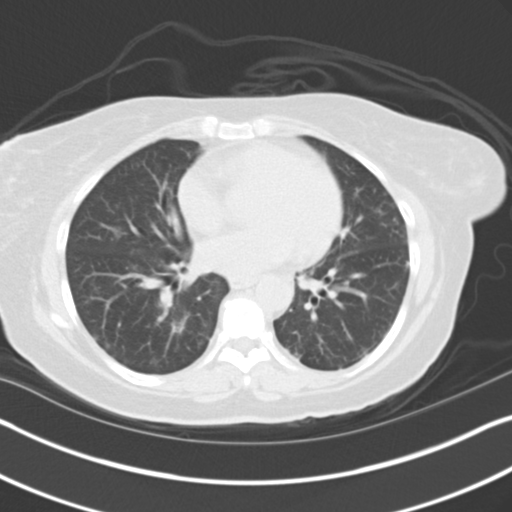

[Series 5: coronal · coronal · 0.81mm/px · 3 of 158 slices shown]
[im 53/158  soft-tissue]
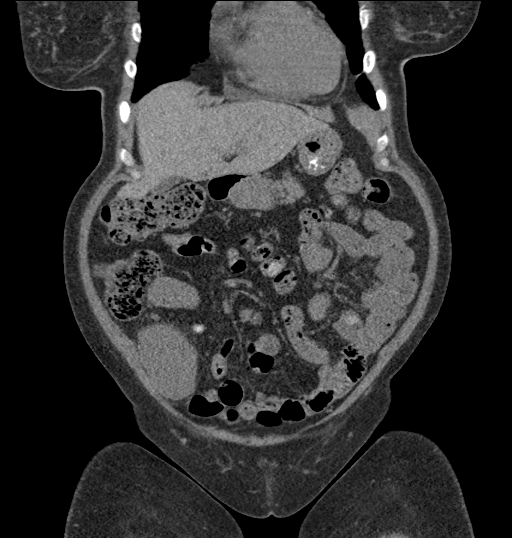
[im 70/158  soft-tissue]
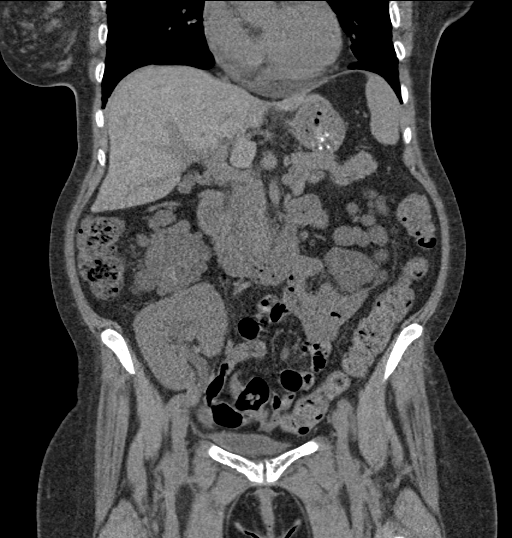
[im 88/158  soft-tissue]
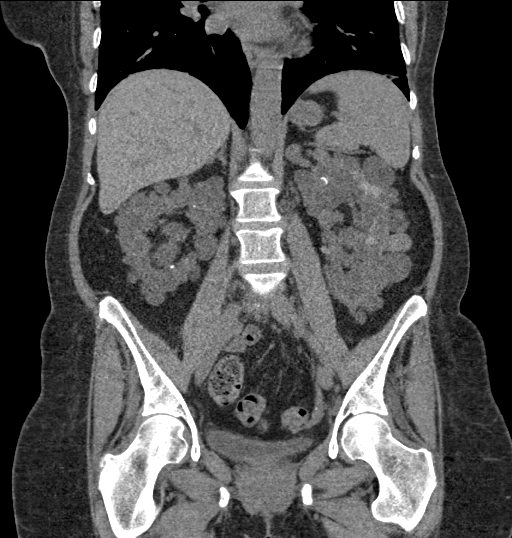

[15 of 42 positions shown; findings below may reference images not displayed]

FINDINGS: Lower chest: There are bibasilar ground-glass airspace opacities. A
few subpleural reticular lung markings are noted.The heart size is
normal.

Hepatobiliary: Multiple simple appearing hepatic cysts are noted.
Normal gallbladder.There is no biliary ductal dilation.

Pancreas: The pancreas is unremarkable.

Spleen: There is a peripherally calcified area in the upper pole of
the spleen likely related to an old traumatic or infectious insult.

Adrenals/Urinary Tract:

--Adrenal glands: No adrenal hemorrhage.

--Right kidney/ureter: There are innumerable cysts involving the
right kidney. There is no evidence for right-sided hydronephrosis.

--Left kidney/ureter: There are innumerable cysts involving the left
kidney. There is a hyperdense 2.5 cm nodule arising from the upper
pole measuring approximately 25 Hounsfield units.

--Urinary bladder: Unremarkable.

There is a right lower quadrant transplant kidney with mild
pelviectasis. There is no radiopaque obstructing stone.

Stomach/Bowel:

--Stomach/Duodenum: No hiatal hernia or other gastric abnormality.
Normal duodenal course and caliber.

--Small bowel: No dilatation or inflammation.

--Colon: No focal abnormality.

--Appendix: Normal.

Vascular/Lymphatic: Atherosclerotic calcification is present within
the non-aneurysmal abdominal aorta, without hemodynamically
significant stenosis.

--No retroperitoneal lymphadenopathy.

--No mesenteric lymphadenopathy.

--No pelvic or inguinal lymphadenopathy.

Reproductive: Status post hysterectomy. No adnexal mass.

Other: No ascites or free air. There is a fat containing umbilical
hernia.

Musculoskeletal. No acute displaced fractures.
IMPRESSION: 1. Right pelvic transplant kidney with mild pelviectasis. There is
no evidence for an obstructing lesion or stone.
2. Polycystic kidney disease of the native kidneys. There are few
hyperdense nodules involving the left kidney favored to represent a
proteinaceous or hemorrhagic cyst. Outpatient renal ultrasound
follow-up is recommended.
3. Scattered airspace opacities at the lung bases bilaterally may
represent an atypical or opportunistic infection in the appropriate
clinical setting.

## 2020-10-05 ENCOUNTER — Other Ambulatory Visit (HOSPITAL_BASED_OUTPATIENT_CLINIC_OR_DEPARTMENT_OTHER): Payer: Self-pay

## 2020-10-05 DIAGNOSIS — G4700 Insomnia, unspecified: Secondary | ICD-10-CM
